# Patient Record
Sex: Female | Born: 1995 | Race: White | Hispanic: No | Marital: Single | Smoking: Never smoker
Health system: Southern US, Community
[De-identification: ages and names within clinical notes are randomized; demographics above are authoritative.]

## PROBLEM LIST (undated history)

## (undated) DIAGNOSIS — K219 Gastro-esophageal reflux disease without esophagitis: Secondary | ICD-10-CM

## (undated) HISTORY — PX: BREAST SURGERY: SHX581

---

## 2014-07-30 ENCOUNTER — Other Ambulatory Visit: Payer: Self-pay | Admitting: Gastroenterology

## 2014-07-30 DIAGNOSIS — R1013 Epigastric pain: Secondary | ICD-10-CM

## 2014-07-30 DIAGNOSIS — R1033 Periumbilical pain: Secondary | ICD-10-CM

## 2014-08-13 ENCOUNTER — Ambulatory Visit (HOSPITAL_COMMUNITY)
Admission: RE | Admit: 2014-08-13 | Discharge: 2014-08-13 | Disposition: A | Discharge status: Home / Self Care | Payer: BC Managed Care – PPO | Source: Ambulatory Visit | Attending: Gastroenterology | Admitting: Gastroenterology

## 2014-08-13 ENCOUNTER — Encounter (HOSPITAL_COMMUNITY)
Admission: RE | Admit: 2014-08-13 | Discharge: 2014-08-13 | Disposition: A | Discharge status: Home / Self Care | Payer: BC Managed Care – PPO | Source: Ambulatory Visit | Attending: Gastroenterology | Admitting: Gastroenterology

## 2014-08-13 DIAGNOSIS — R1013 Epigastric pain: Secondary | ICD-10-CM

## 2014-08-13 DIAGNOSIS — R1033 Periumbilical pain: Secondary | ICD-10-CM

## 2014-08-13 DIAGNOSIS — K769 Liver disease, unspecified: Secondary | ICD-10-CM | POA: Diagnosis not present

## 2014-08-13 MED ORDER — SINCALIDE 5 MCG IJ SOLR
INTRAMUSCULAR | Status: AC
  Filled 2014-08-13: qty 5

## 2014-08-13 MED ORDER — TECHNETIUM TC 99M MEBROFENIN IV KIT
5.0000 | PACK | Freq: Once | INTRAVENOUS | Status: AC | PRN
  Administered 2014-08-13: 5 via INTRAVENOUS

## 2014-08-13 MED ORDER — SINCALIDE 5 MCG IJ SOLR
0.0200 ug/kg | Freq: Once | INTRAMUSCULAR | Status: AC
  Administered 2014-08-13: 5 ug via INTRAVENOUS

## 2014-08-13 MED ORDER — STERILE WATER FOR INJECTION IJ SOLN
INTRAMUSCULAR | Status: AC
  Administered 2014-08-13: 10 mL
  Filled 2014-08-13: qty 10

## 2014-09-02 ENCOUNTER — Other Ambulatory Visit (INDEPENDENT_AMBULATORY_CARE_PROVIDER_SITE_OTHER): Payer: Self-pay | Admitting: Surgery

## 2014-09-24 ENCOUNTER — Encounter (HOSPITAL_COMMUNITY)
Admission: RE | Admit: 2014-09-24 | Discharge: 2014-09-24 | Disposition: A | Discharge status: Home / Self Care | Payer: BC Managed Care – PPO | Source: Ambulatory Visit | Attending: Surgery | Admitting: Surgery

## 2014-09-24 ENCOUNTER — Encounter (HOSPITAL_COMMUNITY): Payer: Self-pay

## 2014-09-24 DIAGNOSIS — Z01812 Encounter for preprocedural laboratory examination: Secondary | ICD-10-CM | POA: Diagnosis present

## 2014-09-24 HISTORY — DX: Gastro-esophageal reflux disease without esophagitis: K21.9

## 2014-09-24 LAB — CBC
HEMATOCRIT: 41.5 % (ref 36.0–46.0)
Hemoglobin: 14.1 g/dL (ref 12.0–15.0)
MCH: 28.3 pg (ref 26.0–34.0)
MCHC: 34 g/dL (ref 30.0–36.0)
MCV: 83.2 fL (ref 78.0–100.0)
Platelets: 255 10*3/uL (ref 150–400)
RBC: 4.99 MIL/uL (ref 3.87–5.11)
RDW: 13 % (ref 11.5–15.5)
WBC: 6 10*3/uL (ref 4.0–10.5)

## 2014-09-24 LAB — HCG, SERUM, QUALITATIVE: Preg, Serum: NEGATIVE

## 2014-09-24 NOTE — Pre-Procedure Instructions (Signed)
Isabel Cooper  09/24/2014   Your procedure is scheduled on:  Wednesday, Dec. 9th   Report to [facility] Admitting at 1:00 PM.   Call this number if you have problems the morning of surgery: [phone]   Remember:   Do not eat food or drink liquids after midnight Tuesday.   Take these medicines the morning of surgery with A SIP OF WATER: Omeprazole   Do not wear jewelry, make-up or nail polish.  Do not wear lotions, powders, or perfumes. You may NOT wear deodorant.  Do not shave underarms & legs 48 hours prior to surgery.    Do not bring valuables to the hospital.  [facility] is not responsible for any belongings or valuables.               Contacts, dentures or bridgework may not be worn into surgery.  Leave suitcase in the car. After surgery it may be brought to your room.  For patients admitted to the hospital, discharge time is determined by your treatment team.                 Name and phone number of your driver:    Special Instructions: "Preparing for Surgery" instruction sheet.   Please read over the following fact sheets that you were given: Pain Booklet, Coughing and Deep Breathing and Surgical Site Infection Prevention

## 2014-09-29 MED ORDER — CEFAZOLIN SODIUM-DEXTROSE 2-3 GM-% IV SOLR
2.0000 g | INTRAVENOUS | Status: AC
  Administered 2014-09-30: 2 g via INTRAVENOUS
  Filled 2014-09-29: qty 50

## 2014-09-29 NOTE — H&P (Signed)
Isabel Cooper 09/02/2014 3:39 PM Location: [facility] Patient #: [id number] DOB: 29-Jan-1996 Single / Language: [place] P[place] / Race: White Female  History of Present Illness (Talyn Eddie A. Magnus IvanBlackman MD; 09/02/2014 3:57 PM) Patient words: intial visit for gallbladder.  The patient is a 18 year old female who presents with abdominal pain. this is a pleasant 18 year old female referred by Dr. Loreta AveMann for evaluation of possible biliary colic. She is accompanied by her mother. she has been having bloating and abdominal discomfort for approximately 5 months. It occurs after about anything she eats. She has no nausea or vomiting. bowel movements are nomal. her workup has been normal   Other Problems Jake Church(Jason McDowell, LPN; 04/54/098111/08/2014 3:39 PM) No pertinent past medical history  Past Surgical History Jake Church(Jason McDowell, LPN; 19/14/782911/08/2014 3:39 PM) Mammoplasty; Reduction Bilateral.  Diagnostic Studies History Jake Church(Jason McDowell, LPN; 56/21/308611/08/2014 3:39 PM) Colonoscopy never Mammogram never  Allergies Jake Church(Jason McDowell, LPN; 57/84/696211/08/2014 3:40 PM) No Known Drug Allergies11/08/2014  Medication History Jake Church(Jason McDowell, LPN; 95/28/413211/08/2014 3:40 PM) Omeprazole (40MG  Capsule DR, Oral) Active.  Family History Jake Church(Jason McDowell, LPN; 44/01/027211/08/2014 3:39 PM) Heart Disease Family Members In General.  Pregnancy / Birth History Jake Church(Jason McDowell, LPN; 53/66/440311/08/2014 3:39 PM) Age at menarche 10 years. Gravida 0 Para 0  Review of Systems Jake Church(Jason McDowell LPN; 47/42/595611/08/2014 3:39 PM) Skin Not Present- Change in Wart/Mole, Dryness, Hives, Jaundice, New Lesions, Non-Healing Wounds, Rash and Ulcer. Breast Not Present- Breast Mass, Breast Pain, Nipple Discharge and Skin Changes. Gastrointestinal Present- Indigestion. Not Present- Abdominal Pain, Bloating, Bloody Stool, Change in Bowel Habits, Chronic diarrhea, Constipation, Difficulty Swallowing, Excessive gas, Gets full quickly at meals, Hemorrhoids, Nausea, Rectal Pain  and Vomiting. Neurological Not Present- Decreased Memory, Fainting, Headaches, Numbness, Seizures, Tingling, Tremor, Trouble walking and Weakness.   Vitals Jake Church(Jason McDowell LPN; 38/75/643311/08/2014 3:41 PM) 09/02/2014 3:40 PM Weight: 192.38 lb Height: 63in Body Surface Area: 1.97 m Body Mass Index: 34.08 kg/m Temp.: 98.46F(Temporal)  Pulse: 88 (Regular)  Resp.: 16 (Unlabored)  BP: 126/92 (Sitting, Left Arm, Standard)    Physical Exam (Alvie Fowles A. Magnus IvanBlackman MD; 09/02/2014 3:57 PM) General Mental Status-Alert. General Appearance-Consistent with stated age. Hydration-Well hydrated. Voice-Normal.  Head and Neck Head-normocephalic, atraumatic with no lesions or palpable masses.  Eye Eyeball - Bilateral-Extraocular movements intact. Sclera/Conjunctiva - Bilateral-No scleral icterus.  Chest and Lung Exam Chest and lung exam reveals -quiet, even and easy respiratory effort with no use of accessory muscles and on auscultation, normal breath sounds, no adventitious sounds and normal vocal resonance. Inspection Chest Wall - Normal. Back - normal.  Cardiovascular Cardiovascular examination reveals -on palpation PMI is normal in location and amplitude, no palpable S3 or S4. Normal cardiac borders., normal heart sounds, regular rate and rhythm with no murmurs, carotid auscultation reveals no bruits and normal pedal pulses bilaterally.  Abdomen Inspection Inspection of the abdomen reveals - No Hernias. Skin - Scar - no surgical scars. Palpation/Percussion Palpation and Percussion of the abdomen reveal - Soft, Non Tender, No Rebound tenderness, No Rigidity (guarding) and No hepatosplenomegaly. Auscultation Auscultation of the abdomen reveals - Bowel sounds normal.  Neurologic Neurologic evaluation reveals -alert and oriented x 3 with no impairment of recent or remote memory. Mental Status-Normal.  Musculoskeletal Normal Exam - Left-Upper Extremity Strength  Normal and Lower Extremity Strength Normal. Normal Exam - Right-Upper Extremity Strength Normal, Lower Extremity Weakness.    Assessment & Plan (Malisa Ruggiero A. 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 she has a very strong family history. I discussed the surgical procedure with her in detail. I gave her literature regarding the surgery. I discussed the risks. These ,nclude but are not limited to bleeding, infection, bile duct injury, bile leak, injury to surrounding structures, the need to convert to an open procedure, and the chance this may not resolve her symptoms. I also discussed postoperative recovery.

## 2014-09-30 ENCOUNTER — Encounter (HOSPITAL_COMMUNITY)
Admission: RE | Disposition: A | Discharge status: Home / Self Care | Payer: Self-pay | Source: Ambulatory Visit | Attending: Surgery

## 2014-09-30 ENCOUNTER — Ambulatory Visit (HOSPITAL_COMMUNITY)
Admission: RE | Admit: 2014-09-30 | Discharge: 2014-09-30 | Disposition: A | Discharge status: Home / Self Care | Payer: BC Managed Care – PPO | Source: Ambulatory Visit | Attending: Surgery | Admitting: Surgery

## 2014-09-30 ENCOUNTER — Ambulatory Visit (HOSPITAL_COMMUNITY): Payer: BC Managed Care – PPO | Admitting: Anesthesiology

## 2014-09-30 ENCOUNTER — Encounter (HOSPITAL_COMMUNITY): Payer: Self-pay | Admitting: *Deleted

## 2014-09-30 DIAGNOSIS — K219 Gastro-esophageal reflux disease without esophagitis: Secondary | ICD-10-CM | POA: Insufficient documentation

## 2014-09-30 DIAGNOSIS — K811 Chronic cholecystitis: Secondary | ICD-10-CM | POA: Insufficient documentation

## 2014-09-30 DIAGNOSIS — E669 Obesity, unspecified: Secondary | ICD-10-CM | POA: Diagnosis not present

## 2014-09-30 HISTORY — PX: CHOLECYSTECTOMY: SHX55

## 2014-09-30 SURGERY — LAPAROSCOPIC CHOLECYSTECTOMY
Anesthesia: General | Site: Abdomen

## 2014-09-30 MED ORDER — FENTANYL CITRATE 0.05 MG/ML IJ SOLN
INTRAMUSCULAR | Status: AC
  Filled 2014-09-30: qty 5

## 2014-09-30 MED ORDER — ONDANSETRON HCL 4 MG/2ML IJ SOLN
INTRAMUSCULAR | Status: DC | PRN
  Administered 2014-09-30: 4 mg via INTRAVENOUS

## 2014-09-30 MED ORDER — PROPOFOL 10 MG/ML IV BOLUS
INTRAVENOUS | Status: AC
  Filled 2014-09-30: qty 20

## 2014-09-30 MED ORDER — SUCCINYLCHOLINE CHLORIDE 20 MG/ML IJ SOLN
INTRAMUSCULAR | Status: DC | PRN
  Administered 2014-09-30: 100 mg via INTRAVENOUS

## 2014-09-30 MED ORDER — ONDANSETRON HCL 4 MG/2ML IJ SOLN
INTRAMUSCULAR | Status: AC
  Filled 2014-09-30: qty 2

## 2014-09-30 MED ORDER — DEXAMETHASONE SODIUM PHOSPHATE 4 MG/ML IJ SOLN
INTRAMUSCULAR | Status: DC | PRN
  Administered 2014-09-30: 4 mg via INTRAVENOUS

## 2014-09-30 MED ORDER — MIDAZOLAM HCL 2 MG/2ML IJ SOLN
INTRAMUSCULAR | Status: AC
Start: 2014-09-30 — End: 2014-09-30
  Filled 2014-09-30: qty 2

## 2014-09-30 MED ORDER — LIDOCAINE HCL (CARDIAC) 20 MG/ML IV SOLN
INTRAVENOUS | Status: AC
  Filled 2014-09-30: qty 5

## 2014-09-30 MED ORDER — PROPOFOL 10 MG/ML IV BOLUS
INTRAVENOUS | Status: AC
Start: 2014-09-30 — End: 2014-09-30
  Filled 2014-09-30: qty 20

## 2014-09-30 MED ORDER — 0.9 % SODIUM CHLORIDE (POUR BTL) OPTIME
TOPICAL | Status: DC | PRN
  Administered 2014-09-30: 1000 mL

## 2014-09-30 MED ORDER — FENTANYL CITRATE 0.05 MG/ML IJ SOLN
INTRAMUSCULAR | Status: DC | PRN
  Administered 2014-09-30: 75 ug via INTRAVENOUS
  Administered 2014-09-30: 100 ug via INTRAVENOUS
  Administered 2014-09-30: 25 ug via INTRAVENOUS

## 2014-09-30 MED ORDER — PROMETHAZINE HCL 25 MG/ML IJ SOLN: 6.2500 mg | INTRAMUSCULAR | Status: DC | PRN

## 2014-09-30 MED ORDER — KETOROLAC TROMETHAMINE 30 MG/ML IJ SOLN
INTRAMUSCULAR | Status: DC | PRN
  Administered 2014-09-30: 30 mg via INTRAVENOUS

## 2014-09-30 MED ORDER — BUPIVACAINE-EPINEPHRINE 0.25% -1:200000 IJ SOLN
INTRAMUSCULAR | Status: DC | PRN
  Administered 2014-09-30: 30 mL

## 2014-09-30 MED ORDER — MEPERIDINE HCL 25 MG/ML IJ SOLN: 6.2500 mg | INTRAMUSCULAR | Status: DC | PRN

## 2014-09-30 MED ORDER — LACTATED RINGERS IV SOLN
INTRAVENOUS | Status: DC | PRN
  Administered 2014-09-30: 14:00:00 via INTRAVENOUS

## 2014-09-30 MED ORDER — FENTANYL CITRATE 0.05 MG/ML IJ SOLN
25.0000 ug | INTRAMUSCULAR | Status: DC | PRN
  Administered 2014-09-30 (×4): 25 ug via INTRAVENOUS

## 2014-09-30 MED ORDER — HYDROCODONE-ACETAMINOPHEN 5-325 MG PO TABS: 1.0000 | ORAL_TABLET | ORAL | Status: AC | PRN

## 2014-09-30 MED ORDER — FENTANYL CITRATE 0.05 MG/ML IJ SOLN
INTRAMUSCULAR | Status: AC
  Filled 2014-09-30: qty 2

## 2014-09-30 MED ORDER — LACTATED RINGERS IV SOLN
INTRAVENOUS | Status: DC
  Administered 2014-09-30: 13:00:00 via INTRAVENOUS

## 2014-09-30 MED ORDER — SODIUM CHLORIDE 0.9 % IR SOLN
Status: DC | PRN
  Administered 2014-09-30: 1000 mL

## 2014-09-30 MED ORDER — LIDOCAINE HCL (CARDIAC) 20 MG/ML IV SOLN
INTRAVENOUS | Status: DC | PRN
  Administered 2014-09-30: 40 mg via INTRAVENOUS

## 2014-09-30 MED ORDER — DEXAMETHASONE SODIUM PHOSPHATE 4 MG/ML IJ SOLN
INTRAMUSCULAR | Status: AC
  Filled 2014-09-30: qty 1

## 2014-09-30 MED ORDER — PROPOFOL 10 MG/ML IV BOLUS
INTRAVENOUS | Status: DC | PRN
  Administered 2014-09-30: 200 mg via INTRAVENOUS
  Administered 2014-09-30: 100 mg via INTRAVENOUS

## 2014-09-30 MED ORDER — MIDAZOLAM HCL 5 MG/5ML IJ SOLN
INTRAMUSCULAR | Status: DC | PRN
  Administered 2014-09-30: 2 mg via INTRAVENOUS

## 2014-09-30 MED ORDER — KETOROLAC TROMETHAMINE 30 MG/ML IJ SOLN
INTRAMUSCULAR | Status: AC
Start: 2014-09-30 — End: 2014-09-30
  Filled 2014-09-30: qty 1

## 2014-09-30 SURGICAL SUPPLY — 41 items
APPLIER CLIP 5 13 M/L LIGAMAX5 (MISCELLANEOUS) ×3
BANDAGE ADH SHEER 1  50/CT (GAUZE/BANDAGES/DRESSINGS) ×3 IMPLANT
BENZOIN TINCTURE PRP APPL 2/3 (GAUZE/BANDAGES/DRESSINGS) ×3 IMPLANT
CANISTER SUCTION 2500CC (MISCELLANEOUS) ×3 IMPLANT
CHLORAPREP W/TINT 26ML (MISCELLANEOUS) ×3 IMPLANT
CLIP APPLIE 5 13 M/L LIGAMAX5 (MISCELLANEOUS) ×1 IMPLANT
CLOSURE WOUND 1/2 X4 (GAUZE/BANDAGES/DRESSINGS) ×1
COVER SURGICAL LIGHT HANDLE (MISCELLANEOUS) ×3 IMPLANT
DRAPE C-ARM 42X72 X-RAY (DRAPES) IMPLANT
DRAPE LAPAROSCOPIC ABDOMINAL (DRAPES) ×3 IMPLANT
DRAPE UTILITY XL STRL (DRAPES) ×6 IMPLANT
ELECT REM PT RETURN 9FT ADLT (ELECTROSURGICAL) ×3
ELECTRODE REM PT RTRN 9FT ADLT (ELECTROSURGICAL) ×1 IMPLANT
GLOVE BIOGEL PI IND STRL 7.0 (GLOVE) ×1 IMPLANT
GLOVE BIOGEL PI IND STRL 7.5 (GLOVE) ×1 IMPLANT
GLOVE BIOGEL PI INDICATOR 7.0 (GLOVE) ×2
GLOVE BIOGEL PI INDICATOR 7.5 (GLOVE) ×2
GLOVE SURG SIGNA 7.5 PF LTX (GLOVE) ×3 IMPLANT
GOWN STRL REUS W/ TWL LRG LVL3 (GOWN DISPOSABLE) ×2 IMPLANT
GOWN STRL REUS W/ TWL XL LVL3 (GOWN DISPOSABLE) ×1 IMPLANT
GOWN STRL REUS W/TWL LRG LVL3 (GOWN DISPOSABLE) ×4
GOWN STRL REUS W/TWL XL LVL3 (GOWN DISPOSABLE) ×2
KIT BASIN OR (CUSTOM PROCEDURE TRAY) ×3 IMPLANT
KIT ROOM TURNOVER OR (KITS) ×3 IMPLANT
NS IRRIG 1000ML POUR BTL (IV SOLUTION) ×3 IMPLANT
PAD ARMBOARD 7.5X6 YLW CONV (MISCELLANEOUS) ×3 IMPLANT
POUCH SPECIMEN RETRIEVAL 10MM (ENDOMECHANICALS) ×3 IMPLANT
SCISSORS LAP 5X35 DISP (ENDOMECHANICALS) ×3 IMPLANT
SET CHOLANGIOGRAPH 5 50 .035 (SET/KITS/TRAYS/PACK) IMPLANT
SET IRRIG TUBING LAPAROSCOPIC (IRRIGATION / IRRIGATOR) ×3 IMPLANT
SLEEVE ENDOPATH XCEL 5M (ENDOMECHANICALS) ×6 IMPLANT
SPECIMEN JAR SMALL (MISCELLANEOUS) ×3 IMPLANT
STRIP CLOSURE SKIN 1/2X4 (GAUZE/BANDAGES/DRESSINGS) ×2 IMPLANT
SUT MON AB 4-0 PC3 18 (SUTURE) ×3 IMPLANT
TOWEL OR 17X24 6PK STRL BLUE (TOWEL DISPOSABLE) ×3 IMPLANT
TOWEL OR 17X26 10 PK STRL BLUE (TOWEL DISPOSABLE) ×3 IMPLANT
TRAY LAPAROSCOPIC (CUSTOM PROCEDURE TRAY) ×3 IMPLANT
TROCAR XCEL BLUNT TIP 100MML (ENDOMECHANICALS) ×3 IMPLANT
TROCAR XCEL NON-BLD 5MMX100MML (ENDOMECHANICALS) ×3 IMPLANT
TUBING INSUFFLATION (TUBING) ×3 IMPLANT
WATER STERILE IRR 1000ML POUR (IV SOLUTION) IMPLANT

## 2014-09-30 NOTE — Interval H&P Note (Signed)
History and Physical Interval Note:no change in H and P  09/30/2014 1:15 PM  Isabel SheriffKaterina Burstein  has presented today for surgery, with the diagnosis of Biliary Dyskinisia  The various methods of treatment have been discussed with the patient and family. After consideration of risks, benefits and other options for treatment, the patient has consented to  Procedure(s): LAPAROSCOPIC CHOLECYSTECTOMY (N/A) as a surgical intervention .  The patient's history has been reviewed, patient examined, no change in status, stable for surgery.  I have reviewed the patient's chart and labs.  Questions were answered to the patient's satisfaction.     [name]

## 2014-09-30 NOTE — Anesthesia Preprocedure Evaluation (Addendum)
Anesthesia Evaluation  Patient identified by MRN, date of birth, ID band Patient awake    Reviewed: Allergy & Precautions, H&P , NPO status , Patient's Chart, lab work & pertinent test results, reviewed documented beta blocker date and time   Airway Mallampati: II   Neck ROM: Full    Dental  (+) Teeth Intact, Chipped, Dental Advisory Given,    Pulmonary  breath sounds clear to auscultation        Cardiovascular Rhythm:Regular     Neuro/Psych    GI/Hepatic GERD-  Medicated,  Endo/Other    Renal/GU      Musculoskeletal   Abdominal (+) + obese,   Peds  Hematology   Anesthesia Other Findings   Reproductive/Obstetrics                            Anesthesia Physical Anesthesia Plan  ASA: II  Anesthesia Plan: General   Post-op Pain Management:    Induction:   Airway Management Planned: Oral ETT  Additional Equipment:   Intra-op Plan:   Post-operative Plan: Extubation in OR  Informed Consent: I have reviewed the patients History and Physical, chart, labs and discussed the procedure including the risks, benefits and alternatives for the proposed anesthesia with the patient or authorized representative who has indicated his/her understanding and acceptance.     Plan Discussed with:   Anesthesia Plan Comments:         Anesthesia Quick Evaluation

## 2014-09-30 NOTE — Op Note (Signed)

## 2014-09-30 NOTE — Anesthesia Procedure Notes (Signed)
Procedure Name: Intubation Date/Time: 09/30/2014 2:00 PM Performed by: Orvilla FusATO, Harol Shabazz A Pre-anesthesia Checklist: Patient identified, Timeout performed, Emergency Drugs available, Suction available and Patient being monitored Patient Re-evaluated:Patient Re-evaluated prior to inductionOxygen Delivery Method: Circle system utilized Preoxygenation: Pre-oxygenation with 100% oxygen Intubation Type: IV induction Ventilation: Mask ventilation without difficulty Grade View: Grade I Tube type: Oral Tube size: 7.0 mm Number of attempts: 1 Airway Equipment and Method: Stylet Placement Confirmation: ETT inserted through vocal cords under direct vision,  positive ETCO2 and breath sounds checked- equal and bilateral Secured at: 21 cm Tube secured with: Tape Dental Injury: Teeth and Oropharynx as per pre-operative assessment

## 2014-09-30 NOTE — Transfer of Care (Signed)
Immediate Anesthesia Transfer of Care Note  Patient: Isabel Cooper  Procedure(s) Performed: Procedure(s): LAPAROSCOPIC CHOLECYSTECTOMY (N/A)  Patient Location: PACU  Anesthesia Type:General  Level of Consciousness: awake, alert  and oriented  Airway & Oxygen Therapy: Patient Spontanous Breathing and Patient connected to nasal cannula oxygen  Post-op Assessment: Report given to PACU RN, Post -op Vital signs reviewed and stable and Patient moving all extremities  Post vital signs: Reviewed and stable  Complications: No apparent anesthesia complications

## 2014-09-30 NOTE — Discharge Instructions (Signed)
[facility] ______[facility] LAPAROSCOPIC SURGERY: POST OP INSTRUCTIONS Always review your discharge instruction sheet given to you by the facility where your surgery was performed. IF YOU HAVE DISABILITY OR FAMILY LEAVE FORMS, YOU MUST BRING THEM TO THE OFFICE FOR PROCESSING.   DO NOT GIVE THEM TO YOUR DOCTOR.  1. A prescription for pain medication may be given to you upon discharge.  Take your pain medication as prescribed, if needed.  If narcotic pain medicine is not needed, then you may take acetaminophen (Tylenol) or ibuprofen (Advil) as needed. 2. Take your usually prescribed medications unless otherwise directed. 3. If you need a refill on your pain medication, please contact your pharmacy.  They will contact our office to request authorization. Prescriptions will not be filled after 5pm or on week-ends. 4. You should follow a light diet the first few days after arrival home, such as soup and crackers, etc.  Be sure to include lots of fluids daily. 5. Most patients will experience some swelling and bruising in the area of the incisions.  Ice packs will help.  Swelling and bruising can take several days to resolve.  6. It is common to experience some constipation if taking pain medication after surgery.  Increasing fluid intake and taking a stool softener (such as Colace) will usually help or prevent this problem from occurring.  A mild laxative (Milk of Magnesia or Miralax) should be taken according to package instructions if there are no bowel movements after 48 hours. 7. Unless discharge instructions indicate otherwise, you may remove your bandages 24-48 hours after surgery, and you may shower at that time.  You may have steri-strips (small skin tapes) in place directly over the incision.  These strips should be left on the skin for 7-10 days.  If your surgeon used skin glue on the incision, you may shower in 24 hours.  The glue will flake off over the next 2-3 weeks.  Any sutures or  staples will be removed at the office during your follow-up visit. 8. ACTIVITIES:  You may resume regular (light) daily activities beginning the next day--such as daily self-care, walking, climbing stairs--gradually increasing activities as tolerated.  You may have sexual intercourse when it is comfortable.  Refrain from any heavy lifting or straining until approved by your doctor. a. You may drive when you are no longer taking prescription pain medication, you can comfortably wear a seatbelt, and you can safely maneuver your car and apply brakes. b. RETURN TO WORK:  __________________________________________________________ 9. You should see your doctor in the office for a follow-up appointment approximately 2-3 weeks after your surgery.  Make sure that you call for this appointment within a day or two after you arrive home to insure a convenient appointment time. 10. OTHER INSTRUCTIONS: ___ice pack and ibuprofin also for pain. 11. No lifting more than 15 pounds for 2 weeks. 12. May shower tomorrow_______________________________________________________________________________________________________________________ __________________________________________________________________________________________________________________________ WHEN TO CALL YOUR DOCTOR: 1. Fever over 101.0 2. Inability to urinate 3. Continued bleeding from incision. 4. Increased pain, redness, or drainage from the incision. 5. Increasing abdominal pain  The clinic staff is available to answer your questions during regular business hours.  Please dont hesitate to call and ask to speak to one of the nurses for clinical concerns.  If you have a medical emergency, go to the nearest emergency room or call 911.  A surgeon from [facility] is always on call at the hospital. [address], [address], [place], [place]  [address] ? [address] [address], [place], [place]   [id number] [phone] ? [phone] ? [phone] [phone]  [phone] Web site: [email or web address]  What to eat:  For your first meals, you should eat lightly; only small meals initially.  If you do not have nausea, you may eat larger meals.  Avoid spicy, greasy and heavy food.    General Anesthesia, Adult, Care After  Refer to this sheet in the next few weeks. These instructions provide you with information on caring for yourself after your procedure. Your health care provider may also give you more specific instructions. Your treatment has been planned according to current medical practices, but problems sometimes occur. Call your health care provider if you have any problems or questions after your procedure.  WHAT TO EXPECT AFTER THE PROCEDURE  After the procedure, it is typical to experience:  Sleepiness.  Nausea and vomiting. HOME CARE INSTRUCTIONS  For the first 24 hours after general anesthesia:  Have a responsible person with you.  Do not drive a car. If you are alone, do not take public transportation.  Do not drink alcohol.  Do not take medicine that has not been prescribed by your health care provider.  Do not sign important papers or make important decisions.  You may resume a normal diet and activities as directed by your health care provider.  Change bandages (dressings) as directed.  If you have questions or problems that seem related to general anesthesia, call the hospital and ask for the anesthetist or anesthesiologist on call. SEEK MEDICAL CARE IF:  You have nausea and vomiting that continue the day after anesthesia.  You develop a rash. SEEK IMMEDIATE MEDICAL CARE IF:  You have difficulty breathing.  You have chest pain.  You have any allergic problems. Document Released: 01/15/2001 Document Revised: 06/11/2013 Document Reviewed: 04/24/2013  [facility] Patient Information 2014 [employer] [place].

## 2014-09-30 NOTE — Anesthesia Postprocedure Evaluation (Signed)
  Anesthesia Post-op Note  Patient: Johna SheriffKaterina Bielby  Procedure(s) Performed: Procedure(s): LAPAROSCOPIC CHOLECYSTECTOMY (N/A)  Patient Location: PACU  Anesthesia Type:General  Level of Consciousness: awake and alert   Airway and Oxygen Therapy: Patient Spontanous Breathing  Post-op Pain: mild  Post-op Assessment: Post-op Vital signs reviewed and Patient's Cardiovascular Status Stable  Post-op Vital Signs: Reviewed and stable  Last Vitals:  Filed Vitals:   09/30/14 1228  BP: 117/73  Pulse: 77  Temp: 36.7 C  Resp: 20    Complications: No apparent anesthesia complications

## 2014-10-02 ENCOUNTER — Encounter (HOSPITAL_COMMUNITY): Payer: Self-pay | Admitting: Surgery

## 2015-11-16 ENCOUNTER — Other Ambulatory Visit: Payer: Self-pay | Admitting: Physician Assistant

## 2015-11-16 DIAGNOSIS — N63 Unspecified lump in unspecified breast: Secondary | ICD-10-CM

## 2015-11-23 ENCOUNTER — Ambulatory Visit
Admission: RE | Admit: 2015-11-23 | Discharge: 2015-11-23 | Disposition: A | Discharge status: Home / Self Care | Payer: BLUE CROSS/BLUE SHIELD | Source: Ambulatory Visit | Attending: Physician Assistant | Admitting: Physician Assistant

## 2015-11-23 DIAGNOSIS — N63 Unspecified lump in unspecified breast: Secondary | ICD-10-CM
